# Patient Record
Sex: Male | Born: 1955
Health system: Southern US, Community
[De-identification: ages and names within clinical notes are randomized; demographics above are authoritative.]

## PROBLEM LIST (undated history)

## (undated) DIAGNOSIS — E079 Disorder of thyroid, unspecified: Secondary | ICD-10-CM

## (undated) DIAGNOSIS — I10 Essential (primary) hypertension: Secondary | ICD-10-CM

## (undated) DIAGNOSIS — S82899A Other fracture of unspecified lower leg, initial encounter for closed fracture: Secondary | ICD-10-CM

---

## 2006-11-29 ENCOUNTER — Emergency Department (HOSPITAL_COMMUNITY): Admission: EM | Admit: 2006-11-29 | Discharge: 2006-11-29 | Payer: Self-pay | Admitting: Emergency Medicine

## 2009-05-01 ENCOUNTER — Encounter: Admission: RE | Admit: 2009-05-01 | Discharge: 2009-05-01 | Payer: Self-pay | Admitting: Family Medicine

## 2011-01-08 ENCOUNTER — Emergency Department (INDEPENDENT_AMBULATORY_CARE_PROVIDER_SITE_OTHER)

## 2011-01-08 ENCOUNTER — Emergency Department (HOSPITAL_BASED_OUTPATIENT_CLINIC_OR_DEPARTMENT_OTHER)
Admission: EM | Admit: 2011-01-08 | Discharge: 2011-01-08 | Disposition: A | Discharge status: Other Acute Inpatient Hospital | Source: Home / Self Care | Attending: Emergency Medicine | Admitting: Emergency Medicine

## 2011-01-08 ENCOUNTER — Inpatient Hospital Stay (HOSPITAL_COMMUNITY)
Admission: AD | Admit: 2011-01-08 | Discharge: 2011-01-09 | DRG: 313 | Disposition: A | Discharge status: Home / Self Care | Source: Other Acute Inpatient Hospital | Attending: Cardiology | Admitting: Cardiology

## 2011-01-08 DIAGNOSIS — R079 Chest pain, unspecified: Secondary | ICD-10-CM | POA: Insufficient documentation

## 2011-01-08 DIAGNOSIS — I10 Essential (primary) hypertension: Secondary | ICD-10-CM | POA: Insufficient documentation

## 2011-01-08 DIAGNOSIS — Z7982 Long term (current) use of aspirin: Secondary | ICD-10-CM

## 2011-01-08 DIAGNOSIS — E663 Overweight: Secondary | ICD-10-CM | POA: Diagnosis present

## 2011-01-08 DIAGNOSIS — R0789 Other chest pain: Principal | ICD-10-CM | POA: Diagnosis present

## 2011-01-08 DIAGNOSIS — Z79899 Other long term (current) drug therapy: Secondary | ICD-10-CM | POA: Insufficient documentation

## 2011-01-08 LAB — LIPID PANEL
Cholesterol: 201 mg/dL — ABNORMAL HIGH (ref 0–200)
HDL: 43 mg/dL (ref >39)
LDL Cholesterol: 127 mg/dL — ABNORMAL HIGH (ref 0–99)
Total CHOL/HDL Ratio: 4.7 RATIO
Triglycerides: 154 mg/dL — ABNORMAL HIGH (ref <150)
VLDL: 31 mg/dL (ref 0–40)

## 2011-01-08 LAB — CK TOTAL AND CKMB (NOT AT ARMC)
CK, MB: 2.8 ng/mL (ref 0.3–4.0)
Relative Index: 2.1 (ref 0.0–2.5)
Total CK: 135 U/L (ref 7–232)

## 2011-01-08 LAB — CBC
HCT: 41.7 % (ref 39.0–52.0)
Hemoglobin: 14.9 g/dL (ref 13.0–17.0)
MCH: 29.8 pg (ref 26.0–34.0)
MCHC: 35.7 g/dL (ref 30.0–36.0)
MCV: 83.4 fL (ref 78.0–100.0)
Platelets: 159 10*3/uL (ref 150–400)
RBC: 5 MIL/uL (ref 4.22–5.81)
RDW: 12 % (ref 11.5–15.5)
WBC: 5.2 10*3/uL (ref 4.0–10.5)

## 2011-01-08 LAB — CARDIAC PANEL(CRET KIN+CKTOT+MB+TROPI)
CK, MB: 2.4 ng/mL (ref 0.3–4.0)
Relative Index: 1.9 (ref 0.0–2.5)
Total CK: 127 U/L (ref 7–232)
Troponin I: <0.3 ng/mL (ref <0.30)

## 2011-01-08 LAB — DIFFERENTIAL
Basophils Absolute: 0 10*3/uL (ref 0.0–0.1)
Basophils Relative: 0 % (ref 0–1)
Eosinophils Absolute: 0.1 10*3/uL (ref 0.0–0.7)
Eosinophils Relative: 2 % (ref 0–5)
Lymphocytes Relative: 40 % (ref 12–46)
Lymphs Abs: 2.1 10*3/uL (ref 0.7–4.0)
Monocytes Absolute: 0.5 10*3/uL (ref 0.1–1.0)
Monocytes Relative: 9 % (ref 3–12)
Neutro Abs: 2.5 10*3/uL (ref 1.7–7.7)
Neutrophils Relative %: 49 % (ref 43–77)

## 2011-01-08 LAB — HEPARIN LEVEL (UNFRACTIONATED): Heparin Unfractionated: 0.39 IU/mL (ref 0.30–0.70)

## 2011-01-08 LAB — BASIC METABOLIC PANEL
BUN: 14 mg/dL (ref 6–23)
CO2: 27 mEq/L (ref 19–32)
Calcium: 10.1 mg/dL (ref 8.4–10.5)
Chloride: 101 mEq/L (ref 96–112)
Creatinine, Ser: 1.1 mg/dL (ref 0.4–1.5)
GFR calc Af Amer: >60 mL/min (ref >60)
GFR calc non Af Amer: >60 mL/min (ref >60)
Glucose, Bld: 109 mg/dL — ABNORMAL HIGH (ref 70–99)
Potassium: 4.2 mEq/L (ref 3.5–5.1)
Sodium: 139 mEq/L (ref 135–145)

## 2011-01-08 LAB — HEMOGLOBIN A1C
Hgb A1c MFr Bld: 5.7 % — ABNORMAL HIGH (ref <5.7)
Mean Plasma Glucose: 117 mg/dL — ABNORMAL HIGH (ref <117)

## 2011-01-08 LAB — TSH: TSH: 4.444 u[IU]/mL (ref 0.350–4.500)

## 2011-01-08 LAB — TROPONIN I: Troponin I: <0.3 ng/mL (ref <0.30)

## 2011-01-09 ENCOUNTER — Inpatient Hospital Stay (HOSPITAL_COMMUNITY)

## 2011-01-09 LAB — CBC
HCT: 39 % (ref 39.0–52.0)
Hemoglobin: 13.8 g/dL (ref 13.0–17.0)
MCH: 30 pg (ref 26.0–34.0)
MCHC: 35.4 g/dL (ref 30.0–36.0)
MCV: 84.8 fL (ref 78.0–100.0)
Platelets: 153 10*3/uL (ref 150–400)
RBC: 4.6 MIL/uL (ref 4.22–5.81)
RDW: 12.1 % (ref 11.5–15.5)
WBC: 6.4 10*3/uL (ref 4.0–10.5)

## 2011-01-09 LAB — HEPARIN LEVEL (UNFRACTIONATED)
Heparin Unfractionated: 0.37 IU/mL (ref 0.30–0.70)
Heparin Unfractionated: 0.43 IU/mL (ref 0.30–0.70)

## 2011-01-09 LAB — CARDIAC PANEL(CRET KIN+CKTOT+MB+TROPI)
CK, MB: 2.1 ng/mL (ref 0.3–4.0)
Relative Index: 1.9 (ref 0.0–2.5)
Total CK: 113 U/L (ref 7–232)
Troponin I: <0.3 ng/mL (ref <0.30)

## 2011-01-09 MED ORDER — TECHNETIUM TC 99M TETROFOSMIN IV KIT
30.0000 | PACK | Freq: Once | INTRAVENOUS | Status: AC | PRN
  Administered 2011-01-09: 30 via INTRAVENOUS

## 2011-01-09 MED ORDER — TECHNETIUM TC 99M TETROFOSMIN IV KIT
10.0000 | PACK | Freq: Once | INTRAVENOUS | Status: AC | PRN
  Administered 2011-01-09: 10 via INTRAVENOUS

## 2011-01-10 ENCOUNTER — Other Ambulatory Visit (HOSPITAL_COMMUNITY)

## 2011-02-06 NOTE — Discharge Summary (Signed)
[name], Osborn                 ACCOUNT NO.:  [id number]  MEDICAL RECORD NO.:  [id number]  LOCATION:  4738                         FACILITY:  [facility]  PHYSICIAN:  [place] Estephan Gallardo, MD         DATE OF BIRTH:  55  Sep 23, 1955  DATE OF ADMISSION:  01/08/2011 DATE OF DISCHARGE:  01/09/2011                              DISCHARGE SUMMARY   DISCHARGE DIAGNOSES: 1. Chest pain.  Negative cardiac enzymes x3, currently resolved.     Negative nuclear stress test completed this admission. 2. Dyslipidemia. 3. History of hypertension, treated.  HOSPITAL COURSE:  [name] is a 55 year old Caucasian male with a history of hypertension, diverticulitis, right leg fracture, left arm fracture.  He developed chest pain this past week with exertion which became more severe on January 08, 2011.  He states he had developed pain more severely while working in the yard, this is on the left side, described tingling in his fingers and toes and positive nausea.  She was admitted to rule out acute coronary syndrome.  Cardiac enzymes were cycled x3 and found to be negative.  He was started on IV heparin and given nitroglycerin sublingual for recurrent episodes.  He has had treadmill Myoview stress test as well as we checked his lipids, A1c, TSH.  He had, on January 09, 2011, no complaints of chest pain and his nuclear stress test was negative for ischemia.  He has been seen by Dr. Rennis Golden who feels he is stable for discharge home to follow up with Dr. Herbie Baltimore.  DISCHARGE LABS:  WBC 6.4, hemoglobin 13.8, hematocrit 39.0, platelets 153.  Sodium 139, potassium 4.2, chloride 101, carbon dioxide 27, glucose 109, BUN 14, creatinine 1.10, calcium 10.1.  Hemoglobin A1c 5.7. Cardiac markers were negative x3.  Total cholesterol is 201, triglycerides are 154, HDL 43, LDL 127, VLDL 31, and total cholesterol/HDL ratio was 4.7.  Thyroid stimulating hormone is 4.444.  STUDIES/PROCEDURES: 1. Chest x-ray on January 08, 2011, shows no acute  cardiopulmonary     process.  Cardiomediastinal contours were at upper limits of     normal.  No focal consolidation, pleural effusion, or pneumothorax. 2. On January 09, 2011, treadmill stress test which were revealed no     reversible perfusion defects.  DISCHARGE MEDICATIONS: 1. Aspirin 81 mg enteric coated 1 tablet by mouth daily. 2  Fish oil over the counter 1 capsule by mouth daily. 1. Glucosamine over the counter 2 tablets by mouth daily. 2. Lisinopril 5 mg 1 tablet by mouth daily. 3. Multivitamin therapeutic 1 tablet by mouth daily.  DISPOSITION:  [name] will be discharged home in stable condition.  He has no restrictions.  It was recommended that he eats a heart-healthy diet, low in carbohydrates and fats.  He will follow up with Dr. Herbie Baltimore in approximately 1 month and our office will call him with the appointment time.    ______________________________ Wilburt Finlay, PA   ______________________________ [place] Audrinna Sherman, MD    BH/MEDQ  D:  01/09/2011  T:  01/10/2011  Job:  [id number]  cc:   Landry Corporal, MD  Electronically Signed by Wilburt Finlay PA on 01/22/2011 03:14:08 PM Electronically  Signed by K. Aldous Housel M.D. on 02/06/2011 08:19:44 AM

## 2011-11-26 ENCOUNTER — Encounter (HOSPITAL_COMMUNITY): Payer: Self-pay

## 2011-11-26 ENCOUNTER — Emergency Department (INDEPENDENT_AMBULATORY_CARE_PROVIDER_SITE_OTHER)
Admission: EM | Admit: 2011-11-26 | Discharge: 2011-11-26 | Disposition: A | Discharge status: Nursing Home (Includes ICF) | Source: Home / Self Care | Attending: Emergency Medicine | Admitting: Emergency Medicine

## 2011-11-26 ENCOUNTER — Observation Stay (HOSPITAL_COMMUNITY)
Admission: EM | Admit: 2011-11-26 | Discharge: 2011-11-26 | Disposition: A | Discharge status: Home / Self Care | Attending: Emergency Medicine | Admitting: Emergency Medicine

## 2011-11-26 ENCOUNTER — Emergency Department (INDEPENDENT_AMBULATORY_CARE_PROVIDER_SITE_OTHER)

## 2011-11-26 ENCOUNTER — Encounter (HOSPITAL_COMMUNITY): Payer: Self-pay | Admitting: *Deleted

## 2011-11-26 ENCOUNTER — Emergency Department (HOSPITAL_COMMUNITY)

## 2011-11-26 DIAGNOSIS — I10 Essential (primary) hypertension: Secondary | ICD-10-CM | POA: Insufficient documentation

## 2011-11-26 DIAGNOSIS — M51379 Other intervertebral disc degeneration, lumbosacral region without mention of lumbar back pain or lower extremity pain: Secondary | ICD-10-CM | POA: Insufficient documentation

## 2011-11-26 DIAGNOSIS — R509 Fever, unspecified: Principal | ICD-10-CM | POA: Insufficient documentation

## 2011-11-26 DIAGNOSIS — M549 Dorsalgia, unspecified: Secondary | ICD-10-CM

## 2011-11-26 DIAGNOSIS — M5137 Other intervertebral disc degeneration, lumbosacral region: Secondary | ICD-10-CM | POA: Insufficient documentation

## 2011-11-26 HISTORY — DX: Other fracture of unspecified lower leg, initial encounter for closed fracture: S82.899A

## 2011-11-26 HISTORY — DX: Essential (primary) hypertension: I10

## 2011-11-26 LAB — POCT URINALYSIS DIP (DEVICE)
Bilirubin Urine: NEGATIVE
Glucose, UA: NEGATIVE mg/dL
Ketones, ur: NEGATIVE mg/dL
Nitrite: NEGATIVE
Protein, ur: 30 mg/dL — AB
Specific Gravity, Urine: 1.025 (ref 1.005–1.030)
Urobilinogen, UA: 1 mg/dL (ref 0.0–1.0)
pH: 6.5 (ref 5.0–8.0)

## 2011-11-26 LAB — POCT I-STAT, CHEM 8
BUN: 17 mg/dL (ref 6–23)
Calcium, Ion: 1.19 mmol/L (ref 1.12–1.32)
Chloride: 104 mEq/L (ref 96–112)
Creatinine, Ser: 1.3 mg/dL (ref 0.50–1.35)
Glucose, Bld: 120 mg/dL — ABNORMAL HIGH (ref 70–99)
HCT: 41 % (ref 39.0–52.0)
Hemoglobin: 13.9 g/dL (ref 13.0–17.0)
Potassium: 4.1 mEq/L (ref 3.5–5.1)
Sodium: 138 mEq/L (ref 135–145)
TCO2: 24 mmol/L (ref 0–100)

## 2011-11-26 LAB — DIFFERENTIAL
Basophils Absolute: 0 10*3/uL (ref 0.0–0.1)
Basophils Relative: 0 % (ref 0–1)
Eosinophils Absolute: 0.1 10*3/uL (ref 0.0–0.7)
Eosinophils Relative: 1 % (ref 0–5)
Lymphocytes Relative: 12 % (ref 12–46)
Lymphs Abs: 1.3 10*3/uL (ref 0.7–4.0)
Monocytes Absolute: 1.1 10*3/uL — ABNORMAL HIGH (ref 0.1–1.0)
Monocytes Relative: 10 % (ref 3–12)
Neutro Abs: 8.4 10*3/uL — ABNORMAL HIGH (ref 1.7–7.7)
Neutrophils Relative %: 77 % (ref 43–77)

## 2011-11-26 LAB — CBC
HCT: 38.4 % — ABNORMAL LOW (ref 39.0–52.0)
Hemoglobin: 13.6 g/dL (ref 13.0–17.0)
MCH: 30.1 pg (ref 26.0–34.0)
MCHC: 35.4 g/dL (ref 30.0–36.0)
MCV: 85 fL (ref 78.0–100.0)
Platelets: 169 10*3/uL (ref 150–400)
RBC: 4.52 MIL/uL (ref 4.22–5.81)
RDW: 11.9 % (ref 11.5–15.5)
WBC: 11 10*3/uL — ABNORMAL HIGH (ref 4.0–10.5)

## 2011-11-26 MED ORDER — ACETAMINOPHEN 325 MG PO TABS
ORAL_TABLET | ORAL | Status: AC
  Filled 2011-11-26: qty 3

## 2011-11-26 MED ORDER — ACETAMINOPHEN 325 MG PO TABS
975.0000 mg | ORAL_TABLET | Freq: Once | ORAL | Status: AC
  Administered 2011-11-26: 975 mg via ORAL

## 2011-11-26 MED ORDER — GADOBENATE DIMEGLUMINE 529 MG/ML IV SOLN
20.0000 mL | Freq: Once | INTRAVENOUS | Status: AC
  Administered 2011-11-26: 20 mL via INTRAVENOUS

## 2011-11-26 MED ORDER — HYDROCODONE-ACETAMINOPHEN 5-325 MG PO TABS: 1.0000 | ORAL_TABLET | Freq: Four times a day (QID) | ORAL | Status: AC | PRN

## 2011-11-26 MED ORDER — CYCLOBENZAPRINE HCL 10 MG PO TABS: 10.0000 mg | ORAL_TABLET | Freq: Two times a day (BID) | ORAL | Status: AC | PRN

## 2011-11-26 NOTE — ED Notes (Signed)
Pt reports having hurting his back while leaning to the (L) doing yard work on Tuesday.  Reports fever of 101.7 PTA-had tylenol at [facility].  Pt sent from [facility] for eval of a possible retroperitoneal epidural abscess.  Pt denies weakness or numbness in legs.  Reports numbness and pain in the center and (L) of his back.  Pt ambulatory.

## 2011-11-26 NOTE — ED Notes (Signed)
Pt given Coke per Dr. Fredricka Bonine.  Pt will be moved to CDU 4.

## 2011-11-26 NOTE — ED Notes (Signed)
Discharge home with family.  Written and verbal instructions given.

## 2011-11-26 NOTE — ED Provider Notes (Signed)
History     CSN: [id number]  Arrival date & time 11/26/11  1047   First MD Initiated Contact with Patient 11/26/11 1203      Chief Complaint  Patient presents with  . Fever  . Back Pain    Location-L mid to low back/No radiation/quality-dull/duration-4 days/timing-constant/severity-mild/No associated sxs/No prior treatment) Patient is a 56 y.o. male presenting with fever and back pain. The history is provided by the patient and a relative. No language interpreter was used.  Fever Primary symptoms of the febrile illness include fever. Primary symptoms do not include visual change, headaches, cough, wheezing, shortness of breath, abdominal pain, nausea, vomiting, diarrhea, dysuria, altered mental status, myalgias, arthralgias or rash. The current episode started 3 to 5 days ago. This is a new problem. The problem has not changed since onset. Associated with: back pain. Risk factors: No IVDU. Back Pain  Associated symptoms include a fever. Pertinent negatives include no chest pain, no numbness, no headaches, no abdominal pain, no dysuria and no weakness.    Past Medical History  Diagnosis Date  . Hypertension   . Chest pain     myocardial perfusion stress test 2012  . Ankle fracture     History reviewed. No pertinent past surgical history.  History reviewed. No pertinent family history.  History  Substance Use Topics  . Smoking status: Former Games developer  . Smokeless tobacco: Not on file  . Alcohol Use: No      Review of Systems  Constitutional: Positive for fever. Negative for chills.  HENT: Negative for ear pain, congestion, sore throat, rhinorrhea, trouble swallowing, neck pain, neck stiffness, voice change, sinus pressure and tinnitus.   Eyes: Negative for redness and visual disturbance.  Respiratory: Negative for cough, chest tightness, shortness of breath, wheezing and stridor.   Cardiovascular: Negative for chest pain, palpitations and leg swelling.  Gastrointestinal:  Negative for nausea, vomiting, abdominal pain, diarrhea, constipation, blood in stool and abdominal distention.  Genitourinary: Positive for flank pain. Negative for dysuria, urgency, frequency, hematuria, decreased urine volume, penile swelling, scrotal swelling, difficulty urinating, penile pain and testicular pain.  Musculoskeletal: Positive for back pain. Negative for myalgias, joint swelling, arthralgias and gait problem.  Skin: Negative for rash and wound.  Neurological: Negative for dizziness, tremors, seizures, syncope, facial asymmetry, speech difficulty, weakness, light-headedness, numbness and headaches.  Hematological: Negative for adenopathy. Does not bruise/bleed easily.  Psychiatric/Behavioral: Negative for confusion and altered mental status.    Allergies  Review of patient's allergies indicates no known allergies.  Home Medications   Current Outpatient Rx  Name Route Sig Dispense Refill  . ACETAMINOPHEN 325 MG PO TABS Oral Take 975 mg by mouth once as needed. For fever/pain    . ASPIRIN EC 81 MG PO TBEC Oral Take 81 mg by mouth daily.    Marland Kitchen GARLIC PO Oral Take 1 tablet by mouth daily.    Marland Kitchen HYDROCODONE-ACETAMINOPHEN 5-325 MG PO TABS Oral Take 1 tablet by mouth every 6 (six) hours as needed. For pain    . ADULT MULTIVITAMIN W/MINERALS CH Oral Take 1 tablet by mouth daily.    Marland Kitchen FISH OIL PO Oral Take 1 tablet by mouth daily.      BP 129/77  Pulse 74  Temp(Src) 98 F (36.7 C) (Oral)  Resp 14  SpO2 96%  Physical Exam  Constitutional: He is oriented to person, place, and time. He appears well-developed and well-nourished. No distress.  HENT:  Head: Normocephalic and atraumatic.  Eyes: Conjunctivae and EOM  are normal.  Neck: Normal range of motion. Neck supple.  Cardiovascular: Normal rate, regular rhythm, normal heart sounds and intact distal pulses.   No murmur heard. Pulmonary/Chest: Effort normal and breath sounds normal. No respiratory distress. He has no wheezes.  He has no rales.  Abdominal: Soft. Bowel sounds are normal. He exhibits no distension and no mass. There is no tenderness. There is no rebound and no guarding.  Musculoskeletal: Normal range of motion. He exhibits no edema and no tenderness.  Neurological: He is alert and oriented to person, place, and time. He has normal strength. No cranial nerve deficit or sensory deficit. Coordination normal. GCS eye subscore is 4. GCS verbal subscore is 5. GCS motor subscore is 6. He displays no Babinski's sign on the right side. He displays no Babinski's sign on the left side.  Reflex Scores:      Bicep reflexes are 1+ on the right side and 1+ on the left side.      Brachioradialis reflexes are 1+ on the right side and 1+ on the left side.      Patellar reflexes are 2+ on the right side and 2+ on the left side.      Achilles reflexes are 1+ on the right side and 1+ on the left side.      One beat ankle clonus bilat. Neg hoffmans sign. Strength 5/5 in all extremities. Normal perirectal sensation and normal rectal tone.   Skin: Skin is warm and dry. He is not diaphoretic.  Psychiatric: He has a normal mood and affect.    ED Course  Procedures (including critical care time)  Labs Reviewed - No data to display Dg Lumbar Spine Complete  11/26/2011  *RADIOLOGY REPORT*  Clinical Data: Pain and fever.  LUMBAR SPINE - COMPLETE 4+ VIEW  Comparison: None.  Findings: Vertebral body height and alignment are maintained. There is some loss of disc space height and endplate spurring at L5- S1.  No endplate destructive change is identified.  Facet arthropathy lower lumbar spine is noted.  IMPRESSION: No acute finding.  Lower lumbar degenerative change.  Original Report Authenticated By: Bernadene Bell. D'ALESSIO, M.D.     1. Back pain     MDM  Pt is a well appearing 56yo M with no hx of IVDU who presents from urgent care for fever 101.24F today and 4 days of L sided back pain since lifting a heavy object doing yard work  when he felt a "pop". VSS. AF and NAD presently. No midline spine pain and no spine exam deficits. UA and labs at urgent care WNL. WBC count 11. Given no source of infection on exam or imaging plan for MRI T&L spine r/o spinal epidural abscess. Pt transferred to CDU while waiting for MRI. Please f/u with PA note for MRI result and pt dispo.         Consuello Masse, MD 11/26/11 [phone]

## 2011-11-26 NOTE — ED Provider Notes (Cosign Needed)
History     CSN: [id number]  Arrival date & time 11/26/11  0805   First MD Initiated Contact with Patient 11/26/11 [phone]      Chief Complaint  Patient presents with  . Back Pain  . Fever    (Consider location/radiation/quality/duration/timing/severity/associated sxs/prior treatment) HPI Comments: Patient reports doing some yard work 3 days ago, felt a pop in his left lower back, and now reports dull, achy pain worse with going from lying to standing, bending, Carrying heavy objects, and bending forward. Reports intermittent painful muscle spasms. Spouse states patient has felt feverish 2 days ago, but has not taken his temperature at home. Patient reports numbness that wraps around to the side. No rash. Numbness is located approximately L1-L2. She's been giving him her prescription on Norco and an unknown muscle relaxant with temporary relief. Denies any urinary urgency, frequency, cloudy or oderous urine, hematuria, abdominal pain. No chest pain, coughing, wheezing, shortness of breath. No ear pain, sinus pain, sore throat. No saddle anesthesia, bowel/bladder incontinence. Patient has a remote history of injury to his back. Had chickenpox as a child. No history of cancer, IVDU, prolonged steroid use.  ROS as noted in HPI. All other ROS negative.   Patient is a 56 y.o. male presenting with back pain and fever. The history is provided by the patient and the spouse. No language interpreter was used.  Back Pain  This is a new problem. The current episode started more than 2 days ago. The problem has been gradually worsening. The pain is associated with lifting heavy objects. The pain is present in the lumbar spine. The quality of the pain is described as aching. The symptoms are aggravated by bending and certain positions. The pain is the same all the time. Associated symptoms include a fever and numbness. Pertinent negatives include no chest pain, no abdominal pain, no bowel incontinence, no  perianal numbness, no bladder incontinence, no dysuria, no leg pain, no paresthesias and no weakness. He has tried analgesics for the symptoms. The treatment provided mild relief.  Fever Primary symptoms of the febrile illness include fever. Primary symptoms do not include abdominal pain or dysuria.    Past Medical History  Diagnosis Date  . Hypertension   . Chest pain     myocardial perfusion stress test 2012  . Ankle fracture     History reviewed. No pertinent past surgical history.  History reviewed. No pertinent family history.  History  Substance Use Topics  . Smoking status: Former Games developer  . Smokeless tobacco: Not on file  . Alcohol Use: No      Review of Systems  Constitutional: Positive for fever.  Cardiovascular: Negative for chest pain.  Gastrointestinal: Negative for abdominal pain and bowel incontinence.  Genitourinary: Negative for bladder incontinence and dysuria.  Musculoskeletal: Positive for back pain.  Neurological: Positive for numbness. Negative for weakness and paresthesias.    Allergies  Review of patient's allergies indicates no known allergies.  Home Medications   Current Outpatient Rx  Name Route Sig Dispense Refill  . HYDROCODONE-ACETAMINOPHEN 5-325 MG PO TABS Oral Take 1 tablet by mouth every 6 (six) hours as needed.      BP 143/74  Pulse 89  Temp(Src) 101.7 F (38.7 C) (Oral)  Resp 16  SpO2 97%  Physical Exam  Nursing note and vitals reviewed. Constitutional: He is oriented to person, place, and time. He appears well-developed and well-nourished.       febrile  HENT:  Head: Normocephalic and atraumatic.  Eyes: Conjunctivae and EOM are normal.  Neck: Normal range of motion.  Cardiovascular: Normal rate, regular rhythm and normal heart sounds.   Pulmonary/Chest: Effort normal and breath sounds normal. No respiratory distress.  Abdominal: Soft. Bowel sounds are normal. He exhibits no distension and no mass. There is no tenderness.  There is no rebound and no guarding.  Musculoskeletal: Normal range of motion.       Lumbar back: He exhibits tenderness. He exhibits no bony tenderness.       Back:       No rash in area of numbness. Bilateral lower extremities nontender without new rashes or color change, baseline ROM with intact DP pulses, CR<2 secs all digits bilaterally. No pain with PROM hips bilaterally. SLR neg bilaterally. Sensation baseline light touch bilaterally for Pt, DTR's symmetric and intact bilaterally KJ. Pain aggravated with active hip flexion, but Motor symmetric bilateral 5/5 hip flexion, quadriceps, hamstrings, EHL, foot dorsiflexion, foot plantarflexion, gait somewhat antalgic but without apparent new ataxia.   Neurological: He is alert and oriented to person, place, and time.  Skin: Skin is warm and dry. No rash noted.  Psychiatric: He has a normal mood and affect. His behavior is normal.    ED Course  Procedures (including critical care time)  Labs Reviewed  POCT URINALYSIS DIP (DEVICE) - Abnormal; Notable for the following:    Hgb urine dipstick TRACE (*)    Protein, ur 30 (*)    Leukocytes, UA TRACE (*) Biochemical Testing Only. Please order routine urinalysis from main lab if confirmatory testing is needed.   All other components within normal limits  POCT I-STAT, CHEM 8 - Abnormal; Notable for the following:    Glucose, Bld 120 (*)    All other components within normal limits  CBC  DIFFERENTIAL   Dg Lumbar Spine Complete  11/26/2011  *RADIOLOGY REPORT*  Clinical Data: Pain and fever.  LUMBAR SPINE - COMPLETE 4+ VIEW  Comparison: None.  Findings: Vertebral body height and alignment are maintained. There is some loss of disc space height and endplate spurring at L5- S1.  No endplate destructive change is identified.  Facet arthropathy lower lumbar spine is noted.  IMPRESSION: No acute finding.  Lower lumbar degenerative change.  Original Report Authenticated By: Bernadene Bell. D'ALESSIO, M.D.      1. Back pain   2. Fever     Results for orders placed during the hospital encounter of 11/26/11  POCT URINALYSIS DIP (DEVICE)      Component Value Range   Glucose, UA NEGATIVE  NEGATIVE (mg/dL)   Bilirubin Urine NEGATIVE  NEGATIVE    Ketones, ur NEGATIVE  NEGATIVE (mg/dL)   Specific Gravity, Urine 1.025  1.005 - 1.030    Hgb urine dipstick TRACE (*) NEGATIVE    pH 6.5  5.0 - 8.0    Protein, ur 30 (*) NEGATIVE (mg/dL)   Urobilinogen, UA 1.0  0.0 - 1.0 (mg/dL)   Nitrite NEGATIVE  NEGATIVE    Leukocytes, UA TRACE (*) NEGATIVE   POCT I-STAT, CHEM 8      Component Value Range   Sodium 138  135 - 145 (mEq/L)   Potassium 4.1  3.5 - 5.1 (mEq/L)   Chloride 104  96 - 112 (mEq/L)   BUN 17  6 - 23 (mg/dL)   Creatinine, Ser 1.61  0.50 - 1.35 (mg/dL)   Glucose, Bld 096 (*) 70 - 99 (mg/dL)   Calcium, Ion 0.45  4.09 - 1.32 (mmol/L)   TCO2  24  0 - 100 (mmol/L)   Hemoglobin 13.9  13.0 - 17.0 (g/dL)   HCT 16.1  09.6 - 04.5 (%)     MDM  Previous chart, labs, imaging reviewed.  Pt admitted For chest pain 01/2011, had negative nuclear stress test.  Patient has a well demarcated history of trauma, and has muscular tenderness with spasm. the numbness patient is reporting is suggestive of shingles, but there is no rash. There is no CVA tenderness flank tenderness, suprapubic tenderness, but trace leuks and his cheek that. Concern for the fever and back pain. Checking x-ray, CBC, i-STAT.  UA has trace leuks but not suggestive of pyelo. Plain films show arthritis. Concern for fever and local muscular tenderness. Doubt obstructing kidney stone, but this is still in differential. Patient has no history of cancer, steroid use, diabetes, or IVDU, but concern for retroperitoneal, epidural abscess. Give Tylenol here. Transferring for further imaging and evaluation. CBC pending  Luiz Blare, MD 11/26/11 1010

## 2011-11-26 NOTE — Discharge Instructions (Signed)
Back Pain, Adult Low back pain is very common. About 1 in 5 people have back pain.The cause of low back pain is rarely dangerous. The pain often gets better over time.About half of people with a sudden onset of back pain feel better in just 2 weeks. About 8 in 10 people feel better by 6 weeks.  CAUSES Some common causes of back pain include:  Strain of the muscles or ligaments supporting the spine.   Wear and tear (degeneration) of the spinal discs.   Arthritis.   Direct injury to the back.  DIAGNOSIS Most of the time, the direct cause of low back pain is not known.However, back pain can be treated effectively even when the exact cause of the pain is unknown.Answering your caregiver's questions about your overall health and symptoms is one of the most accurate ways to make sure the cause of your pain is not dangerous. If your caregiver needs more information, he or she may order lab work or imaging tests (X-rays or MRIs).However, even if imaging tests show changes in your back, this usually does not require surgery. HOME CARE INSTRUCTIONS For many people, back pain returns.Since low back pain is rarely dangerous, it is often a condition that people can learn to manageon their own.   Remain active. It is stressful on the back to sit or stand in one place. Do not sit, drive, or stand in one place for more than 30 minutes at a time. Take short walks on level surfaces as soon as pain allows.Try to increase the length of time you walk each day.   Do not stay in bed.Resting more than 1 or 2 days can delay your recovery.   Do not avoid exercise or work.Your body is made to move.It is not dangerous to be active, even though your back may hurt.Your back will likely heal faster if you return to being active before your pain is gone.   Pay attention to your body when you bend and lift. Many people have less discomfortwhen lifting if they bend their knees, keep the load close to their  bodies,and avoid twisting. Often, the most comfortable positions are those that put less stress on your recovering back.   Find a comfortable position to sleep. Use a firm mattress and lie on your side with your knees slightly bent. If you lie on your back, put a pillow under your knees.   Only take over-the-counter or prescription medicines as directed by your caregiver. Over-the-counter medicines to reduce pain and inflammation are often the most helpful.Your caregiver may prescribe muscle relaxant drugs.These medicines help dull your pain so you can more quickly return to your normal activities and healthy exercise.   Put ice on the injured area.   Put ice in a plastic bag.   Place a towel between your skin and the bag.   Leave the ice on for 15 to 20 minutes, 3 to 4 times a day for the first 2 to 3 days. After that, ice and heat may be alternated to reduce pain and spasms.   Ask your caregiver about trying back exercises and gentle massage. This may be of some benefit.   Avoid feeling anxious or stressed.Stress increases muscle tension and can worsen back pain.It is important to recognize when you are anxious or stressed and learn ways to manage it.Exercise is a great option.  SEEK MEDICAL CARE IF:  You have pain that is not relieved with rest or medicine.   You have   pain that does not improve in 1 week.   You have new symptoms.   You are generally not feeling well.  SEEK IMMEDIATE MEDICAL CARE IF:   You have pain that radiates from your back into your legs.   You develop new bowel or bladder control problems.   You have unusual weakness or numbness in your arms or legs.   You develop nausea or vomiting.   You develop abdominal pain.   You feel faint.  Document Released: 07/20/2005 Document Revised: 07/09/2011 Document Reviewed: 12/08/2010 [facility] Patient Information 2012 [place], [place].Fever of Unknown Origin Fever of "unknown origin" is a fever of at least 101  F (38.3 C) or greater, and that has gone on daily for three weeks. It is a fever which has a hidden cause. Fever is a higher-than-normal body temperature. Normal temperature is usually defined as 98.6 F or 37 C. Fever is a symptom, not a disease. A fever may mean that there is something else going on in the body that is causing it. CAUSES Fever can be caused by many conditions, including:   Infections.   Tissue injuries.   Medicines.   Different diseases.   Being in hot surroundings.   Tumors or cancers (this is a rare cause).  SYMPTOMS The signs and symptoms of a fever depend on the cause. At first, a fever can cause a chill. When the brain raises the body's "thermostat," the body responds by shivering to raise the temperature. Shivering produces heat in the body. Once the temperature goes up, the person often feels warm. When the fever goes away, the person may start to sweat. DIAGNOSIS  There can be many causes of fever. Sometimes, the reason can be very difficult to find. Your caregiver may have to do numerous tests to track down the reason. TREATMENT   Medication may be used to control fever.   Do not use aspirin because of the association with Reye's syndrome.   If an infection is suspected to be causing the fever and medications have been prescribed, take them as directed. Finish the full course of medications until they are gone.   Sponging or bathing in lukewarm water can cool the skin and reduce body temperature. Ice water or alcohol sponge baths are not as effective as lukewarm water and should not be used.  HOME CARE  Continue to eat normally.   Drink enough fluids to keep urine clear or pale yellow.   Broths, decaffeinated tea, decaffeinated soft drinks, and oral rehydration solutions (ORS) can help replace fluids and electrolytes.   Keep all follow-up appointments as directed by your caregiver.   Weigh yourself once a day. Write down the weights and bring them  to your follow-up appointments to review with your caregiver.  SEEK IMMEDIATE MEDICAL CARE IF:   You or your child is unable to keep fluids down.   Vomiting or diarrhea develop or are present and become persistent (continued).   There is excessive weakness, dizziness, fainting or extreme thirst.   You have a fever or persistent symptoms for more than 72 hours.   You have a fever and your symptoms suddenly get worse.  Document Released: 06/05/2004 Document Revised: 07/09/2011 Document Reviewed: 07/20/2005 [facility] Patient Information 2012 [place], [place].

## 2011-11-26 NOTE — ED Notes (Addendum)
Pt states began having sharp pains in left side of his lower back while working 2 days ago.  States also having "numbness" over lt flank and fever since yesterday.  Continues to have sharp pains in lt lower back usually with movement.  Denies urinary sx. Taking wifes pain medication.

## 2011-11-26 NOTE — ED Provider Notes (Signed)
4:52 PM Patient has had negative imaging for epidural spinal abscess on MRI. Labs completed at urgent care are also negative for infection. No obvious cause for fever and back pain however recommend continued followup with primary care physician in the meantime using ibuprofen and Tylenol for fever if needed.   Results for orders placed during the hospital encounter of 11/26/11  POCT URINALYSIS DIP (DEVICE)      Component Value Range   Glucose, UA NEGATIVE  NEGATIVE (mg/dL)   Bilirubin Urine NEGATIVE  NEGATIVE    Ketones, ur NEGATIVE  NEGATIVE (mg/dL)   Specific Gravity, Urine 1.025  1.005 - 1.030    Hgb urine dipstick TRACE (*) NEGATIVE    pH 6.5  5.0 - 8.0    Protein, ur 30 (*) NEGATIVE (mg/dL)   Urobilinogen, UA 1.0  0.0 - 1.0 (mg/dL)   Nitrite NEGATIVE  NEGATIVE    Leukocytes, UA TRACE (*) NEGATIVE   CBC      Component Value Range   WBC 11.0 (*) 4.0 - 10.5 (K/uL)   RBC 4.52  4.22 - 5.81 (MIL/uL)   Hemoglobin 13.6  13.0 - 17.0 (g/dL)   HCT 78.4 (*) 69.6 - 52.0 (%)   MCV 85.0  78.0 - 100.0 (fL)   MCH 30.1  26.0 - 34.0 (pg)   MCHC 35.4  30.0 - 36.0 (g/dL)   RDW 29.5  28.4 - 13.2 (%)   Platelets 169  150 - 400 (K/uL)  DIFFERENTIAL      Component Value Range   Neutrophils Relative 77  43 - 77 (%)   Neutro Abs 8.4 (*) 1.7 - 7.7 (K/uL)   Lymphocytes Relative 12  12 - 46 (%)   Lymphs Abs 1.3  0.7 - 4.0 (K/uL)   Monocytes Relative 10  3 - 12 (%)   Monocytes Absolute 1.1 (*) 0.1 - 1.0 (K/uL)   Eosinophils Relative 1  0 - 5 (%)   Eosinophils Absolute 0.1  0.0 - 0.7 (K/uL)   Basophils Relative 0  0 - 1 (%)   Basophils Absolute 0.0  0.0 - 0.1 (K/uL)  POCT I-STAT, CHEM 8      Component Value Range   Sodium 138  135 - 145 (mEq/L)   Potassium 4.1  3.5 - 5.1 (mEq/L)   Chloride 104  96 - 112 (mEq/L)   BUN 17  6 - 23 (mg/dL)   Creatinine, Ser 4.40  0.50 - 1.35 (mg/dL)   Glucose, Bld 102 (*) 70 - 99 (mg/dL)   Calcium, Ion 7.25  3.66 - 1.32 (mmol/L)   TCO2 24  0 - 100 (mmol/L)   Hemoglobin 13.9  13.0 - 17.0 (g/dL)   HCT 44.0  34.7 - 42.5 (%)   Dg Lumbar Spine Complete  11/26/2011  *RADIOLOGY REPORT*  Clinical Data: Pain and fever.  LUMBAR SPINE - COMPLETE 4+ VIEW  Comparison: None.  Findings: Vertebral body height and alignment are maintained. There is some loss of disc space height and endplate spurring at L5- S1.  No endplate destructive change is identified.  Facet arthropathy lower lumbar spine is noted.  IMPRESSION: No acute finding.  Lower lumbar degenerative change.  Original Report Authenticated By: Bernadene Bell. Maricela Curet, M.D.   Mr Thoracic Spine W Wo Contrast  11/26/2011  *RADIOLOGY REPORT*  Clinical Data: Several days of back pain.  Fever.  Numbness.  MRI THORACIC SPINE WITHOUT AND WITH CONTRAST  Technique:  Multiplanar and multiecho pulse sequences of the thoracic spine were obtained without  and with intravenous contrast.  Contrast: 20mL MULTIHANCE GADOBENATE DIMEGLUMINE 529 MG/ML IV SOLN  Comparison: Chest x-ray dated 01/28/2011  Findings: Scan extends from C7-T1 through T12-L1.  The vertebral bodies are normal.  There are no disc protrusions or bulges.  The thoracic spinal cord is normal except for slight distortion caused by a minimal thoracic scoliosis centered at T2-3 with convexity to the left.  There is minimal right facet arthritis at C6-7.  The other facet joints are normal.  The paraspinal soft tissues are normal.  There is no pathologic enhancement after contrast administration except for minimal enhancement of the right facet joint at T6-7 consistent with arthritis.  Specifically, no evidence of epidural abscess or other infection in the thoracic spine.  IMPRESSION: No significant abnormality of the thoracic spine.  Original Report Authenticated By: Gwynn Burly, M.D.   Mr Lumbar Spine W Wo Contrast  11/26/2011  *RADIOLOGY REPORT*  Clinical Data: Back pain and fever.  MRI LUMBAR SPINE WITHOUT AND WITH CONTRAST  Technique:  Multiplanar and multiecho pulse  sequences of the lumbar spine were obtained without and with intravenous contrast.  Contrast: 20mL MULTIHANCE GADOBENATE DIMEGLUMINE 529 MG/ML IV SOLN  Comparison: Lumbar radiographs dated 11/26/2011  Findings: Scan extends from T11-12 through S2.  Tip of the conus is at L2 and appears normal.  T11-12 through L4-5:  Normal.  L5 S1:  Small broad-based disc bulge with small protrusions into the neural foramina bilaterally with bilateral foraminal stenosis. The L5 nerves appear to exit with no impingement.  No significant facet joint disease.  There is no evidence of pathologic enhancement after contrast administration.  No epidural abscess, diskitis, or other significant abnormality.  IMPRESSION:  1.  No evidence of epidural abscess or other evidence of infection involving the lumbar spine. 2.  Degenerative disc disease at L5-S1 with a small disc protrusions into the neural foramina bilaterally without neural impingement.  Original Report Authenticated By: Gwynn Burly, M.D.      Thomasene Lot, PA-C 11/26/11 1715

## 2011-11-28 NOTE — ED Provider Notes (Signed)
Evaluation and management procedures were performed by the PA/NP/resident physician under my supervision/collaboration.   Arraya Buck D Viveca Beckstrom, MD 11/28/11 2053 

## 2011-12-29 ENCOUNTER — Encounter (HOSPITAL_COMMUNITY): Payer: Self-pay | Admitting: *Deleted

## 2011-12-29 ENCOUNTER — Emergency Department (INDEPENDENT_AMBULATORY_CARE_PROVIDER_SITE_OTHER)
Admission: EM | Admit: 2011-12-29 | Discharge: 2011-12-29 | Disposition: A | Discharge status: Home / Self Care | Payer: Self-pay | Source: Home / Self Care | Attending: Family Medicine | Admitting: Family Medicine

## 2011-12-29 ENCOUNTER — Emergency Department (INDEPENDENT_AMBULATORY_CARE_PROVIDER_SITE_OTHER)

## 2011-12-29 DIAGNOSIS — S20219A Contusion of unspecified front wall of thorax, initial encounter: Secondary | ICD-10-CM

## 2011-12-29 DIAGNOSIS — S20212A Contusion of left front wall of thorax, initial encounter: Secondary | ICD-10-CM

## 2011-12-29 MED ORDER — HYDROCODONE-ACETAMINOPHEN 5-325 MG PO TABS: 1.0000 | ORAL_TABLET | Freq: Four times a day (QID) | ORAL | Status: AC | PRN
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
## 2011-12-29 NOTE — Discharge Instructions (Signed)
Use ice for soreness and medicine as needed, activity as tolerated, do not operate mower if you use pain medicine. Return if needed.
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
## 2019-05-29 ENCOUNTER — Other Ambulatory Visit: Payer: Self-pay | Admitting: Registered"

## 2019-05-29 DIAGNOSIS — Z20822 Contact with and (suspected) exposure to covid-19: Secondary | ICD-10-CM

## 2019-05-30 LAB — NOVEL CORONAVIRUS, NAA: SARS-CoV-2, NAA: NOT DETECTED

## 2020-03-25 ENCOUNTER — Other Ambulatory Visit: Payer: Self-pay

## 2020-03-25 ENCOUNTER — Emergency Department (HOSPITAL_BASED_OUTPATIENT_CLINIC_OR_DEPARTMENT_OTHER): Payer: Medicare Other

## 2020-03-25 ENCOUNTER — Encounter (HOSPITAL_BASED_OUTPATIENT_CLINIC_OR_DEPARTMENT_OTHER): Payer: Self-pay | Admitting: *Deleted

## 2020-03-25 ENCOUNTER — Emergency Department (HOSPITAL_BASED_OUTPATIENT_CLINIC_OR_DEPARTMENT_OTHER)
Admission: EM | Admit: 2020-03-25 | Discharge: 2020-03-25 | Disposition: A | Discharge status: Home / Self Care | Payer: Medicare Other | Attending: Emergency Medicine | Admitting: Emergency Medicine

## 2020-03-25 DIAGNOSIS — Z79899 Other long term (current) drug therapy: Secondary | ICD-10-CM | POA: Insufficient documentation

## 2020-03-25 DIAGNOSIS — E039 Hypothyroidism, unspecified: Secondary | ICD-10-CM | POA: Insufficient documentation

## 2020-03-25 DIAGNOSIS — U071 COVID-19: Secondary | ICD-10-CM

## 2020-03-25 DIAGNOSIS — J1282 Pneumonia due to coronavirus disease 2019: Secondary | ICD-10-CM | POA: Diagnosis not present

## 2020-03-25 DIAGNOSIS — R509 Fever, unspecified: Secondary | ICD-10-CM | POA: Diagnosis present

## 2020-03-25 DIAGNOSIS — Z7982 Long term (current) use of aspirin: Secondary | ICD-10-CM | POA: Diagnosis not present

## 2020-03-25 DIAGNOSIS — Z87891 Personal history of nicotine dependence: Secondary | ICD-10-CM | POA: Insufficient documentation

## 2020-03-25 DIAGNOSIS — J189 Pneumonia, unspecified organism: Secondary | ICD-10-CM

## 2020-03-25 HISTORY — DX: Disorder of thyroid, unspecified: E07.9

## 2020-03-25 LAB — CBG MONITORING, ED: Glucose-Capillary: 131 mg/dL — ABNORMAL HIGH (ref 70–99)

## 2020-03-25 MED ORDER — CEPHALEXIN 500 MG PO CAPS: 500.0000 mg | ORAL_CAPSULE | Freq: Three times a day (TID) | ORAL | 0 refills | Status: AC

## 2020-03-25 MED ORDER — AZITHROMYCIN 250 MG PO TABS: 250.0000 mg | ORAL_TABLET | Freq: Every day | ORAL | 0 refills | Status: AC

## 2020-03-25 MED FILL — CEPHALEXIN 500 MG CAPSULE: 500 | 7 days supply | Qty: 21 | Fill #0

## 2020-03-25 MED FILL — AZITHROMYCIN 250 MG TABS: 250 | 5 days supply | Qty: 6 | Fill #0

## 2020-03-25 NOTE — ED Triage Notes (Addendum)
Pt states he was diagnosed on Aug 14th with covid. States he has been treated with tessalon pearls, which is not helping. States he is still having fevers. Denies feeling sob. Concerned about his temp at home of 104. Pt is diaphoretic on arrival. CBG 131. States he did take tylenol at 530 this morning. States he just feels weak. Sinus on monitor. 02 sat 94% on RA.

## 2020-03-25 NOTE — ED Provider Notes (Signed)
[facility] EMERGENCY DEPARTMENT Provider Note   CSN: [id number] Arrival date & time: 03/25/20  7628     History Chief Complaint  Patient presents with  . Fever    Covid +    Peter Osborn is a 64 y.o. male.  HPI      8/14 diagnosed with COVID No shortness of breath Came because of high fever 104 Cough, can be productive at times, sometimes deep, sometimes not.  Was coughing up phlegm but not coughing up anything now Fevers started a few days ago and progressively getting worse Felt a little light headed and diaphoretic on arrival but not sure because of fever  No nausea or vomiting No diarrhea No sore throat or runny nose Denies tick bites/IVDU, urinary symptoms, rash   Past Medical History:  Diagnosis Date  . Ankle fracture   . Chest pain    myocardial perfusion stress test 2012  . Thyroid disease     There are no problems to display for this patient.   History reviewed. No pertinent surgical history.     No family history on file.  Social History   Tobacco Use  . Smoking status: Former Games developer  . Smokeless tobacco: Never Used  Substance Use Topics  . Alcohol use: No  . Drug use: No    Home Medications Prior to Admission medications   Medication Sig Start Date End Date Taking? Authorizing Provider  Levothyroxine Sodium (SYNTHROID PO) Take by mouth.   Yes [provider]  acetaminophen (TYLENOL) 325 MG tablet Take 975 mg by mouth once as needed. For fever/pain    [provider]  aspirin EC 81 MG tablet Take 81 mg by mouth daily.    [provider]  azithromycin (ZITHROMAX) 250 MG tablet Take 1 tablet (250 mg total) by mouth daily. Take first 2 tablets together, then 1 every day until finished. 03/25/20   Alvira Monday, MD  cephALEXin (KEFLEX) 500 MG capsule Take 1 capsule (500 mg total) by mouth 3 (three) times daily for 7 days. 03/25/20 04/01/20  Alvira Monday, MD  GARLIC PO Take 1 tablet by mouth daily.     [provider]  HYDROcodone-acetaminophen (NORCO) 5-325 MG per tablet Take 1 tablet by mouth every 6 (six) hours as needed. For pain    [provider]  Multiple Vitamin (MULITIVITAMIN WITH MINERALS) TABS Take 1 tablet by mouth daily.    [provider]  Omega-3 Fatty Acids (FISH OIL PO) Take 1 tablet by mouth daily.    [provider]    Allergies    Patient has no known allergies.  Review of Systems   Review of Systems  Constitutional: Positive for appetite change and fever.  HENT: Negative for sore throat.   Eyes: Negative for visual disturbance.  Respiratory: Positive for cough. Negative for shortness of breath.   Cardiovascular: Negative for chest pain.  Gastrointestinal: Negative for abdominal pain, diarrhea, nausea and vomiting.  Genitourinary: Negative for difficulty urinating and dysuria.  Musculoskeletal: Negative for back pain and neck stiffness.  Skin: Negative for rash.  Neurological: Negative for syncope and headaches.    Physical Exam Updated Vital Signs BP 103/75 (BP Location: Right Arm)   Pulse 65   Temp 99 F (37.2 C) (Oral)   Resp 13   Ht 5\' 10"  (1.778 m)   Wt 99.3 kg   SpO2 96%   BMI 31.42 kg/m   Physical Exam Vitals and nursing note reviewed.  Constitutional:  General: He is not in acute distress.    Appearance: He is well-developed. He is not diaphoretic.  HENT:     Head: Normocephalic and atraumatic.  Eyes:     Conjunctiva/sclera: Conjunctivae normal.  Cardiovascular:     Rate and Rhythm: Normal rate and regular rhythm.     Heart sounds: Normal heart sounds. No murmur heard.  No friction rub. No gallop.   Pulmonary:     Effort: Pulmonary effort is normal. No respiratory distress.     Breath sounds: Normal breath sounds. No wheezing or rales.  Abdominal:     General: There is no distension.     Palpations: Abdomen is soft.     Tenderness: There is no abdominal tenderness. There is no guarding.    Musculoskeletal:     Cervical back: Normal range of motion.  Skin:    General: Skin is warm and dry.  Neurological:     Mental Status: He is alert and oriented to person, place, and time.     ED Results / Procedures / Treatments   Labs (all labs ordered are listed, but only abnormal results are displayed) Labs Reviewed  CBG MONITORING, ED - Abnormal; Notable for the following components:      Result Value   Glucose-Capillary 131 (*)    All other components within normal limits    EKG EKG Interpretation  Date/Time:  Monday March 25 2020 06:38:56 EDT Ventricular Rate:  78 PR Interval:    QRS Duration: 91 QT Interval:  355 QTC Calculation: 405 R Axis:   93 Text Interpretation: Sinus rhythm Consider left atrial enlargement Confirmed by Palumbo, April ([id number]) on 03/25/2020 6:44:58 AM   Radiology DG Chest Portable 1 View  Result Date: 03/25/2020 CLINICAL DATA:  Cough and fever. EXAM: PORTABLE CHEST 1 VIEW COMPARISON:  01/08/2011 FINDINGS: Normal heart size. No pleural effusion identified. New bilateral mid and lower lung zone airspace opacities are identified. Visualized osseous structures are unremarkable. IMPRESSION: 1. New bilateral lower lung zone predominant airspace densities concerning for multifocal infection. Electronically Signed   By: Signa Kell M.D.   On: 03/25/2020 08:07    Procedures Procedures (including critical care time)  Medications Ordered in ED Medications - No data to display  ED Course  I have reviewed the triage vital signs and the nursing notes.  Pertinent labs & imaging results that were available during my care of the patient were reviewed by me and considered in my medical decision making (see chart for details).    MDM Rules/Calculators/A&P                          64yo male with diagnosis of COVID 19 8/14 presents with concern for fever.  CXR shows bilateral lower lung airspace densities concerning for multifocal infection.  Overall,  suspect this represents viral pneumonia from COVID 19 but it is difficult to exclude bacterial infection and given symptoms present for 10 days with new fever over last few feel clinically it is apporpirate to treat with abx. Given keflex and azithromycin.  His O2 saturations remain 93-94% with ambulation and he denies any dyspnea.  Does not report any significant vomiting, diarrhea, do not feel other labs at this time will change course of care. Do not suspect other source of bacterial infection by history and exam.  Given rx for abx, recommend continued quarantine and supportive care. Patient discharged in stable condition with understanding of reasons to return.  Final Clinical Impression(s) / ED Diagnoses Final diagnoses:  Fever, unspecified fever cause  COVID-19  Community acquired pneumonia, unspecified laterality    Rx / DC Orders ED Discharge Orders         Ordered    azithromycin (ZITHROMAX) 250 MG tablet  Daily        03/25/20 0814    cephALEXin (KEFLEX) 500 MG capsule  3 times daily        03/25/20 7169           Alvira Monday, MD 03/26/20 1006

## 2020-09-01 ENCOUNTER — Emergency Department (HOSPITAL_BASED_OUTPATIENT_CLINIC_OR_DEPARTMENT_OTHER)
Admission: EM | Admit: 2020-09-01 | Discharge: 2020-09-01 | Disposition: A | Discharge status: Home / Self Care | Payer: Medicare Other | Attending: Emergency Medicine | Admitting: Emergency Medicine

## 2020-09-01 ENCOUNTER — Other Ambulatory Visit: Payer: Self-pay

## 2020-09-01 ENCOUNTER — Emergency Department (HOSPITAL_BASED_OUTPATIENT_CLINIC_OR_DEPARTMENT_OTHER): Payer: Medicare Other

## 2020-09-01 ENCOUNTER — Encounter (HOSPITAL_BASED_OUTPATIENT_CLINIC_OR_DEPARTMENT_OTHER): Payer: Self-pay | Admitting: Emergency Medicine

## 2020-09-01 DIAGNOSIS — Z23 Encounter for immunization: Secondary | ICD-10-CM | POA: Insufficient documentation

## 2020-09-01 DIAGNOSIS — S6991XA Unspecified injury of right wrist, hand and finger(s), initial encounter: Secondary | ICD-10-CM | POA: Diagnosis present

## 2020-09-01 DIAGNOSIS — S60459A Superficial foreign body of unspecified finger, initial encounter: Secondary | ICD-10-CM

## 2020-09-01 DIAGNOSIS — W3400XA Accidental discharge from unspecified firearms or gun, initial encounter: Secondary | ICD-10-CM | POA: Insufficient documentation

## 2020-09-01 DIAGNOSIS — Z7982 Long term (current) use of aspirin: Secondary | ICD-10-CM | POA: Diagnosis not present

## 2020-09-01 DIAGNOSIS — Z87891 Personal history of nicotine dependence: Secondary | ICD-10-CM | POA: Diagnosis not present

## 2020-09-01 DIAGNOSIS — S60450A Superficial foreign body of right index finger, initial encounter: Secondary | ICD-10-CM | POA: Diagnosis not present

## 2020-09-01 DIAGNOSIS — M79644 Pain in right finger(s): Secondary | ICD-10-CM | POA: Diagnosis present

## 2020-09-01 MED ORDER — CEPHALEXIN 250 MG PO CAPS: 250.0000 mg | ORAL_CAPSULE | Freq: Four times a day (QID) | ORAL | 0 refills | Status: DC

## 2020-09-01 MED ORDER — TETANUS-DIPHTH-ACELL PERTUSSIS 5-2.5-18.5 LF-MCG/0.5 IM SUSY
0.5000 mL | PREFILLED_SYRINGE | Freq: Once | INTRAMUSCULAR | Status: AC
  Administered 2020-09-01: 0.5 mL via INTRAMUSCULAR
  Filled 2020-09-01: qty 0.5

## 2020-09-01 NOTE — Discharge Instructions (Signed)
Take antibiotics as prescribed. Take the entire course, even if symptoms improve. Use Tylenol or ibuprofen as needed for pain. Use ice to help with pain and swelling. Keep your finger elevated to help with swelling and pain. Use the finger splint as needed for pain/symptom control. Call the orthopedic doctor listed below to set up an appointment for further evaluation management of your finger. Return to the emergency room if you develop high fevers, severe worsening pain, thick white pus draining from the area, inability to bend or move your finger, red streaking up your hand, or any new, worsening, or concerning symptoms.
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
## 2020-09-02 ENCOUNTER — Telehealth: Payer: Self-pay

## 2020-09-02 NOTE — Telephone Encounter (Signed)
Called patient need appt with Mardella Layman Wed/Thurs

## 2020-09-04 ENCOUNTER — Encounter: Payer: Self-pay | Admitting: Physician Assistant

## 2020-09-04 ENCOUNTER — Other Ambulatory Visit: Payer: Self-pay

## 2020-09-04 ENCOUNTER — Ambulatory Visit (INDEPENDENT_AMBULATORY_CARE_PROVIDER_SITE_OTHER): Payer: Medicare Other | Admitting: Orthopaedic Surgery

## 2020-09-04 DIAGNOSIS — S60450A Superficial foreign body of right index finger, initial encounter: Secondary | ICD-10-CM

## 2020-09-04 MED ORDER — CEPHALEXIN 500 MG PO CAPS: 500.0000 mg | ORAL_CAPSULE | Freq: Four times a day (QID) | ORAL | 0 refills | Status: AC

## 2020-09-04 MED ORDER — MUPIROCIN 2 % EX OINT: 1.0000 "application " | TOPICAL_OINTMENT | Freq: Two times a day (BID) | CUTANEOUS | 0 refills | Status: AC
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
## 2020-09-11 ENCOUNTER — Other Ambulatory Visit: Payer: Self-pay | Admitting: Physician Assistant

## 2020-09-11 MED ORDER — HYDROCODONE-ACETAMINOPHEN 5-325 MG PO TABS: 1.0000 | ORAL_TABLET | Freq: Three times a day (TID) | ORAL | 0 refills | Status: AC | PRN

## 2020-09-12 ENCOUNTER — Encounter: Payer: Self-pay | Admitting: Orthopaedic Surgery

## 2020-09-12 DIAGNOSIS — S60452A Superficial foreign body of right middle finger, initial encounter: Secondary | ICD-10-CM | POA: Diagnosis not present

## 2020-09-12 DIAGNOSIS — S60450A Superficial foreign body of right index finger, initial encounter: Secondary | ICD-10-CM | POA: Insufficient documentation

## 2020-09-19 ENCOUNTER — Ambulatory Visit (INDEPENDENT_AMBULATORY_CARE_PROVIDER_SITE_OTHER): Payer: Medicare Other | Admitting: Orthopaedic Surgery

## 2020-09-19 DIAGNOSIS — S60452A Superficial foreign body of right middle finger, initial encounter: Secondary | ICD-10-CM

## 2020-09-19 DIAGNOSIS — S60450A Superficial foreign body of right index finger, initial encounter: Secondary | ICD-10-CM
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
## 2020-09-26 ENCOUNTER — Ambulatory Visit (INDEPENDENT_AMBULATORY_CARE_PROVIDER_SITE_OTHER): Payer: Medicare Other | Admitting: Orthopaedic Surgery

## 2020-09-26 ENCOUNTER — Encounter: Payer: Self-pay | Admitting: Orthopaedic Surgery

## 2020-09-26 ENCOUNTER — Other Ambulatory Visit: Payer: Self-pay

## 2020-09-26 VITALS — Ht 70.0 in | Wt 233.0 lb

## 2020-09-26 DIAGNOSIS — S60450A Superficial foreign body of right index finger, initial encounter: Secondary | ICD-10-CM

## 2020-09-26 NOTE — Progress Notes (Signed)
   Post-Op Visit Note   Patient: Peter Osborn           Date of Birth: October 24, 1955           MRN: [id number] Visit Date: 09/26/2020 PCP: Ladora Daniel, PA-C   Assessment & Plan:  Chief Complaint:  Chief Complaint  Patient presents with  . Right Hand - Follow-up    2wk s/p shrapnel removal from index finger   Visit Diagnoses:  1. Foreign body of right index finger     Plan: Patient is a pleasant 65 year old gentleman who comes in today 2 weeks out right index finger shrapnel removal.  He has been doing well.  No pain.  No fevers or chills.  Examination of his index finger reveals well-healing surgical incisions with nylon sutures in place.  No evidence of infection or cellulitis.  He does have mild swelling.  Fingers are warm and well-perfused.  He is neurovascular intact distally.  Today, sutures were removed.  We did apply mupirocin followed by Band-Aid.  He will do this for the next 1 week or until the skin is softened.  He will follow up with [place] in 2 weeks time for recheck.  Call with concerns or questions.  Follow-Up Instructions: Return in about 2 weeks (around 10/10/2020).   Orders:  No orders of the defined types were placed in this encounter.  No orders of the defined types were placed in this encounter.   Imaging: No new imaging  PMFS History: Patient Active Problem List   Diagnosis Date Noted  . Foreign body of right index finger 09/12/2020   Past Medical History:  Diagnosis Date  . Ankle fracture   . Chest pain    myocardial perfusion stress test 2012  . Thyroid disease     History reviewed. No pertinent family history.  History reviewed. No pertinent surgical history. Social History   Occupational History  . Not on file  Tobacco Use  . Smoking status: Former Games developer  . Smokeless tobacco: Never Used  Substance and Sexual Activity  . Alcohol use: Not Currently  . Drug use: No  . Sexual activity: Not on file

## 2020-10-10 ENCOUNTER — Encounter: Payer: Self-pay | Admitting: Orthopaedic Surgery

## 2020-10-10 ENCOUNTER — Other Ambulatory Visit: Payer: Self-pay

## 2020-10-10 ENCOUNTER — Ambulatory Visit (INDEPENDENT_AMBULATORY_CARE_PROVIDER_SITE_OTHER): Payer: Medicare Other | Admitting: Orthopaedic Surgery

## 2020-10-10 VITALS — Ht 70.0 in | Wt 233.0 lb

## 2020-10-10 DIAGNOSIS — S60450A Superficial foreign body of right index finger, initial encounter: Secondary | ICD-10-CM
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
## 2022-02-24 ENCOUNTER — Ambulatory Visit: Payer: Medicare Other | Admitting: Dermatology
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
